# Patient Record
Sex: Male | Born: 1970
Health system: Southern US, Community
[De-identification: ages and names within clinical notes are randomized; demographics above are authoritative.]

## PROBLEM LIST (undated history)

## (undated) DIAGNOSIS — I10 Essential (primary) hypertension: Secondary | ICD-10-CM

## (undated) HISTORY — PX: APPENDECTOMY: SHX54

## (undated) HISTORY — PX: CHOLECYSTECTOMY: SHX55

---

## 2020-07-26 ENCOUNTER — Other Ambulatory Visit: Payer: Self-pay | Admitting: Family Medicine

## 2020-07-26 DIAGNOSIS — B181 Chronic viral hepatitis B without delta-agent: Secondary | ICD-10-CM

## 2020-07-31 ENCOUNTER — Other Ambulatory Visit (HOSPITAL_COMMUNITY): Payer: Self-pay | Admitting: Internal Medicine

## 2020-07-31 ENCOUNTER — Other Ambulatory Visit: Payer: Self-pay

## 2020-07-31 ENCOUNTER — Ambulatory Visit: Payer: Self-pay | Attending: Family Medicine | Admitting: Pharmacist

## 2020-07-31 DIAGNOSIS — Z79899 Other long term (current) drug therapy: Secondary | ICD-10-CM

## 2020-07-31 MED ORDER — VEMLIDY 25 MG PO TABS: ORAL_TABLET | ORAL | 1 refills | Status: DC

## 2020-07-31 NOTE — Addendum Note (Signed)
Addended by: Lois Huxley, Jeannett Senior L on: 07/31/2020 04:28 PM   Modules accepted: Orders

## 2020-07-31 NOTE — Progress Notes (Signed)
   S: Patient presents today for review of their specialty medication.   Patient is currently taking Vemlidy for HepB. Patient is managed by Dr. Docia Chuck  for this.   Dosing: 25 mg daily   Adherence: confirms. Has been taking for 3-4 years.   Efficacy: works well.   Monitoring:  Renal function/SCr: WNL per pt Urine tests (glucose, protein): WNL per pt LFTs: WNL per pt  Current adverse effects: denies    O:     No results found for: WBC, HGB, HCT, MCV, PLT    Chemistry   No results found for: NA, K, CL, CO2, BUN, CREATININE, GLU No results found for: CALCIUM, ALKPHOS, AST, ALT, BILITOT     A/P: 1. Medication review: patient currently on Vemlidy for Hep B. He continues to tolerate the medication well with good results. He has been taking the medication for 3-4 years. Per pt, his lab work is WNL. He has no questions or concerns at this time. No recommendation for any changes.   Butch Penny, PharmD, P[place]y [place], CPP Clinical Pharmacist [facility] [phone].

## 2020-08-14 ENCOUNTER — Ambulatory Visit
Admission: RE | Admit: 2020-08-14 | Discharge: 2020-08-14 | Disposition: A | Discharge status: Home / Self Care | Payer: No Typology Code available for payment source | Source: Ambulatory Visit | Attending: Family Medicine | Admitting: Family Medicine

## 2020-08-14 DIAGNOSIS — B181 Chronic viral hepatitis B without delta-agent: Secondary | ICD-10-CM

## 2020-08-16 ENCOUNTER — Other Ambulatory Visit (HOSPITAL_COMMUNITY): Payer: Self-pay

## 2020-08-22 ENCOUNTER — Other Ambulatory Visit (HOSPITAL_COMMUNITY): Payer: Self-pay

## 2020-08-22 MED FILL — Tenofovir Alafenamide Fumarate Tab 25 MG: ORAL | 30 days supply | Qty: 30 | Fill #0 | Status: CN

## 2020-08-25 ENCOUNTER — Other Ambulatory Visit (HOSPITAL_COMMUNITY): Payer: Self-pay

## 2020-09-01 ENCOUNTER — Other Ambulatory Visit (HOSPITAL_COMMUNITY): Payer: Self-pay

## 2020-09-01 MED FILL — Tenofovir Alafenamide Fumarate Tab 25 MG: ORAL | 30 days supply | Qty: 30 | Fill #0 | Status: CN

## 2020-09-06 ENCOUNTER — Other Ambulatory Visit (HOSPITAL_COMMUNITY): Payer: Self-pay

## 2020-09-28 ENCOUNTER — Other Ambulatory Visit (HOSPITAL_COMMUNITY): Payer: Self-pay

## 2020-09-28 MED ORDER — AMLODIPINE BESYLATE 5 MG PO TABS
5.0000 mg | ORAL_TABLET | Freq: Every day | ORAL | 5 refills | Status: DC
  Filled 2020-09-28: qty 30, 30d supply, fill #0
  Filled 2020-12-29: qty 30, 30d supply, fill #1
  Filled 2021-03-16: qty 30, 30d supply, fill #2
  Filled 2021-05-03: qty 30, 30d supply, fill #3
  Filled 2021-06-01: qty 30, 30d supply, fill #4

## 2020-12-29 ENCOUNTER — Other Ambulatory Visit (HOSPITAL_COMMUNITY): Payer: Self-pay

## 2021-03-14 ENCOUNTER — Other Ambulatory Visit (HOSPITAL_COMMUNITY): Payer: Self-pay

## 2021-03-14 MED ORDER — TENOFOVIR DISOPROXIL FUMARATE 300 MG PO TABS
300.0000 mg | ORAL_TABLET | Freq: Every day | ORAL | 12 refills | Status: DC
  Filled 2021-03-14 – 2021-03-16 (×2): qty 30, 30d supply, fill #0

## 2021-03-15 ENCOUNTER — Other Ambulatory Visit (HOSPITAL_COMMUNITY): Payer: Self-pay

## 2021-03-16 ENCOUNTER — Other Ambulatory Visit (HOSPITAL_COMMUNITY): Payer: Self-pay

## 2021-03-16 ENCOUNTER — Other Ambulatory Visit: Payer: Self-pay | Admitting: Pharmacist

## 2021-03-16 DIAGNOSIS — B181 Chronic viral hepatitis B without delta-agent: Secondary | ICD-10-CM

## 2021-03-16 MED ORDER — TENOFOVIR DISOPROXIL FUMARATE 300 MG PO TABS
300.0000 mg | ORAL_TABLET | Freq: Every day | ORAL | 12 refills | Status: DC
  Filled 2021-03-16: qty 30, 30d supply, fill #0
  Filled 2021-04-09: qty 30, 30d supply, fill #1
  Filled 2021-05-03: qty 30, 30d supply, fill #2
  Filled 2021-06-01 – 2021-06-04 (×3): qty 30, 30d supply, fill #3
  Filled 2021-06-27: qty 30, 30d supply, fill #4
  Filled 2021-07-25: qty 30, 30d supply, fill #5
  Filled 2021-09-05: qty 30, 30d supply, fill #6
  Filled 2021-09-28: qty 30, 30d supply, fill #7
  Filled 2021-10-25: qty 30, 30d supply, fill #8
  Filled 2021-11-15: qty 30, 30d supply, fill #9
  Filled 2021-12-17: qty 30, 30d supply, fill #10
  Filled 2022-01-17: qty 30, 30d supply, fill #11
  Filled 2022-02-18: qty 30, 30d supply, fill #12

## 2021-03-16 MED FILL — Tenofovir Alafenamide Fumarate Tab 25 MG: ORAL | 30 days supply | Qty: 30 | Fill #0 | Status: CN

## 2021-03-19 ENCOUNTER — Other Ambulatory Visit (HOSPITAL_COMMUNITY): Payer: Self-pay

## 2021-03-27 ENCOUNTER — Other Ambulatory Visit (HOSPITAL_COMMUNITY): Payer: Self-pay

## 2021-04-09 ENCOUNTER — Other Ambulatory Visit (HOSPITAL_COMMUNITY): Payer: Self-pay

## 2021-04-13 ENCOUNTER — Other Ambulatory Visit (HOSPITAL_COMMUNITY): Payer: Self-pay

## 2021-04-17 ENCOUNTER — Other Ambulatory Visit (HOSPITAL_COMMUNITY): Payer: Self-pay

## 2021-05-02 ENCOUNTER — Other Ambulatory Visit (HOSPITAL_COMMUNITY): Payer: Self-pay

## 2021-05-03 ENCOUNTER — Other Ambulatory Visit (HOSPITAL_COMMUNITY): Payer: Self-pay

## 2021-05-04 ENCOUNTER — Other Ambulatory Visit (HOSPITAL_COMMUNITY): Payer: Self-pay

## 2021-05-09 ENCOUNTER — Other Ambulatory Visit (HOSPITAL_COMMUNITY): Payer: Self-pay

## 2021-06-01 ENCOUNTER — Other Ambulatory Visit (HOSPITAL_COMMUNITY): Payer: Self-pay

## 2021-06-04 ENCOUNTER — Other Ambulatory Visit (HOSPITAL_COMMUNITY): Payer: Self-pay

## 2021-06-05 ENCOUNTER — Other Ambulatory Visit (HOSPITAL_COMMUNITY): Payer: Self-pay

## 2021-06-26 ENCOUNTER — Other Ambulatory Visit (HOSPITAL_COMMUNITY): Payer: Self-pay

## 2021-06-27 ENCOUNTER — Other Ambulatory Visit (HOSPITAL_COMMUNITY): Payer: Self-pay

## 2021-06-28 ENCOUNTER — Other Ambulatory Visit (HOSPITAL_COMMUNITY): Payer: Self-pay

## 2021-06-29 ENCOUNTER — Other Ambulatory Visit (HOSPITAL_COMMUNITY): Payer: Self-pay

## 2021-06-29 MED ORDER — AMLODIPINE BESYLATE 5 MG PO TABS
5.0000 mg | ORAL_TABLET | Freq: Every day | ORAL | 9 refills | Status: DC
  Filled 2021-06-29: qty 30, 30d supply, fill #0
  Filled 2021-07-25: qty 30, 30d supply, fill #1
  Filled 2021-09-06: qty 30, 30d supply, fill #2
  Filled 2021-10-25: qty 30, 30d supply, fill #3
  Filled 2021-11-20: qty 30, 30d supply, fill #4
  Filled 2021-12-17: qty 30, 30d supply, fill #5
  Filled 2022-01-17: qty 30, 30d supply, fill #6
  Filled 2022-02-18: qty 30, 30d supply, fill #7
  Filled 2022-04-23: qty 30, 30d supply, fill #8
  Filled 2022-05-14: qty 30, 30d supply, fill #9

## 2021-07-25 ENCOUNTER — Other Ambulatory Visit (HOSPITAL_COMMUNITY): Payer: Self-pay

## 2021-08-02 ENCOUNTER — Ambulatory Visit: Payer: No Typology Code available for payment source | Admitting: Pharmacist

## 2021-08-02 ENCOUNTER — Other Ambulatory Visit: Payer: Self-pay

## 2021-08-02 DIAGNOSIS — B181 Chronic viral hepatitis B without delta-agent: Secondary | ICD-10-CM

## 2021-08-03 NOTE — Progress Notes (Signed)
Virtual Visit via Telephone Note ? ?I connected with Patrick Johns on 08/03/21 at  3:15 PM EDT by telephone and verified that I am speaking with the correct person using two identifiers. ? ?Location: ?Patient: Home ?Provider: Office ?  ?I discussed the limitations, risks, security and privacy concerns of performing an evaluation and management service by telephone and the availability of in person appointments. I also discussed with the patient that there may be a patient responsible charge related to this service. The patient expressed understanding and agreed to proceed. ? ?HPI: Patrick Johns is a 51 y.o. male who presents for follow-up of their long-term specialty medication, Viread. ? ?There are no problems to display for this patient. ? ? ?Patient's Medications  ?New Prescriptions  ? No medications on file  ?Previous Medications  ? AMLODIPINE (NORVASC) 5 MG TABLET    Take 1 tablet by mouth daily.  ? TENOFOVIR (VIREAD) 300 MG TABLET    Take 1 tablet (300 mg total) by mouth daily.  ? TENOFOVIR ALAFENAMIDE FUMARATE (VEMLIDY) 25 MG TABS    1 tablet with food orally once a day.  ?Modified Medications  ? No medications on file  ?Discontinued Medications  ? No medications on file  ? ? ?Allergies: ?Not on File ? ?Past Medical History: ?No past medical history on file. ? ?Social History: ?Social History  ? ?Socioeconomic History  ? Marital status: Married  ?  Spouse name: Not on file  ? Number of children: Not on file  ? Years of education: Not on file  ? Highest education level: Not on file  ?Occupational History  ? Not on file  ?Tobacco Use  ? Smoking status: Not on file  ? Smokeless tobacco: Not on file  ?Substance and Sexual Activity  ? Alcohol use: Not on file  ? Drug use: Not on file  ? Sexual activity: Not on file  ?Other Topics Concern  ? Not on file  ?Social History Narrative  ? Not on file  ? ?Social Determinants of Health  ? ?Financial Resource Strain: Not on file  ?Food Insecurity: Not on file   ?Transportation Needs: Not on file  ?Physical Activity: Not on file  ?Stress: Not on file  ?Social Connections: Not on file  ? ? ?Labs: ?No results found for: HIV1RNAQUANT, HIV1RNAVL, CD4TABS ? ?RPR and STI ?No results found for: LABRPR, RPRTITER ? ?   ? View : No data to display.  ?  ?  ?  ? ? ?Hepatitis B ?No results found for: HEPBSAB, HEPBSAG, HEPBCAB ?Hepatitis C ?No results found for: [place], HCVRNAPCRQN ?Hepatitis A ?No results found for: HAV ?Lipids: ?No results found for: CHOL, TRIG, HDL, CHOLHDL, VLDL, LDLCALC ? ?Assessment: ?I spoke with Patrick Johns today regarding their specialty medication, Viread. Patient takes it every day without any issues or missed doses. No problems with adverse effects or tolerability except for some fatigue. Patient will take a night to see if that helps. No problems getting it from [facility]. Updated/reviewed medication list - no drug interactions. All questions answered. Will follow up in 1 year. ? ?Plan: ?- Continue Viread ?- Follow up in 1 year ?  ?I discussed the assessment and treatment plan with the patient. The patient was provided an opportunity to ask questions and all were answered. The patient agreed with the plan and demonstrated an understanding of the instructions. ?  ?The patient was advised to call back or seek an in-person evaluation if the symptoms worsen  or if the condition fails to improve as anticipated. ? ?I provided 15 minutes of non-face-to-face time during this encounter. ? ?Janda Cargo L. Nicholaos Schippers, PharmD, BCIDP, AAHIVP, CPP ?Clinical Pharmacist Practitioner ?Infectious Diseases Clinical Pharmacist ?[facility] [place] [facility] ?08/03/2021, 9:53 AM ? ? ?

## 2021-08-31 ENCOUNTER — Other Ambulatory Visit: Payer: Self-pay

## 2021-08-31 DIAGNOSIS — M79629 Pain in unspecified upper arm: Secondary | ICD-10-CM | POA: Insufficient documentation

## 2021-08-31 DIAGNOSIS — Y92410 Unspecified street and highway as the place of occurrence of the external cause: Secondary | ICD-10-CM | POA: Insufficient documentation

## 2021-08-31 DIAGNOSIS — M549 Dorsalgia, unspecified: Secondary | ICD-10-CM | POA: Diagnosis present

## 2021-08-31 DIAGNOSIS — M25519 Pain in unspecified shoulder: Secondary | ICD-10-CM | POA: Insufficient documentation

## 2021-08-31 DIAGNOSIS — M542 Cervicalgia: Secondary | ICD-10-CM | POA: Diagnosis not present
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
## 2021-09-01 ENCOUNTER — Emergency Department (HOSPITAL_BASED_OUTPATIENT_CLINIC_OR_DEPARTMENT_OTHER)
Admission: EM | Admit: 2021-09-01 | Discharge: 2021-09-01 | Disposition: A | Discharge status: Home / Self Care | Payer: No Typology Code available for payment source | Attending: Emergency Medicine | Admitting: Emergency Medicine

## 2021-09-01 NOTE — ED Notes (Signed)
Did not want discharge vitals. 
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
## 2021-09-05 ENCOUNTER — Ambulatory Visit
Admission: RE | Admit: 2021-09-05 | Discharge: 2021-09-05 | Disposition: A | Discharge status: Home / Self Care | Payer: No Typology Code available for payment source | Source: Ambulatory Visit | Attending: Family Medicine | Admitting: Family Medicine

## 2021-09-05 ENCOUNTER — Other Ambulatory Visit: Payer: Self-pay | Admitting: Family Medicine

## 2021-09-05 ENCOUNTER — Other Ambulatory Visit (HOSPITAL_COMMUNITY): Payer: Self-pay

## 2021-09-05 DIAGNOSIS — R52 Pain, unspecified: Secondary | ICD-10-CM

## 2021-09-06 ENCOUNTER — Other Ambulatory Visit (HOSPITAL_COMMUNITY): Payer: Self-pay

## 2021-09-12 ENCOUNTER — Ambulatory Visit: Payer: No Typology Code available for payment source | Attending: Family Medicine | Admitting: Physical Therapy

## 2021-09-12 ENCOUNTER — Encounter: Payer: Self-pay | Admitting: Physical Therapy

## 2021-09-12 DIAGNOSIS — M25542 Pain in joints of left hand: Secondary | ICD-10-CM | POA: Diagnosis not present

## 2021-09-12 DIAGNOSIS — M79645 Pain in left finger(s): Secondary | ICD-10-CM | POA: Diagnosis present

## 2021-09-12 DIAGNOSIS — M6281 Muscle weakness (generalized): Secondary | ICD-10-CM | POA: Insufficient documentation
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
## 2021-09-19 ENCOUNTER — Encounter: Payer: Self-pay | Admitting: Physical Therapy

## 2021-09-19 ENCOUNTER — Ambulatory Visit: Payer: No Typology Code available for payment source | Admitting: Physical Therapy

## 2021-09-19 DIAGNOSIS — M6281 Muscle weakness (generalized): Secondary | ICD-10-CM

## 2021-09-19 DIAGNOSIS — M79645 Pain in left finger(s): Secondary | ICD-10-CM | POA: Diagnosis not present

## 2021-09-19 DIAGNOSIS — M25542 Pain in joints of left hand: Secondary | ICD-10-CM
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
## 2021-09-25 ENCOUNTER — Other Ambulatory Visit (HOSPITAL_COMMUNITY): Payer: Self-pay

## 2021-09-25 ENCOUNTER — Ambulatory Visit: Payer: No Typology Code available for payment source | Admitting: Physical Therapy

## 2021-09-25 ENCOUNTER — Encounter: Payer: Self-pay | Admitting: Physical Therapy

## 2021-09-25 DIAGNOSIS — M79645 Pain in left finger(s): Secondary | ICD-10-CM | POA: Diagnosis not present

## 2021-09-25 DIAGNOSIS — M6281 Muscle weakness (generalized): Secondary | ICD-10-CM

## 2021-09-25 DIAGNOSIS — M25542 Pain in joints of left hand: Secondary | ICD-10-CM
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
## 2021-09-26 ENCOUNTER — Other Ambulatory Visit (HOSPITAL_COMMUNITY): Payer: Self-pay

## 2021-09-28 ENCOUNTER — Other Ambulatory Visit (HOSPITAL_COMMUNITY): Payer: Self-pay

## 2021-10-02 ENCOUNTER — Encounter: Payer: No Typology Code available for payment source | Admitting: Physical Therapy

## 2021-10-09 ENCOUNTER — Ambulatory Visit: Payer: No Typology Code available for payment source | Admitting: Physical Therapy

## 2021-10-09 ENCOUNTER — Telehealth: Payer: Self-pay | Admitting: Physical Therapy
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
## 2021-10-09 NOTE — Telephone Encounter (Signed)
Pt missed PT appointment today 10/09/21 at 11:00am.  PT spoke to Pt and he wasn't clear about what date his next visit was.  He stated he is feeling much better and is ready to d/c.    Baruch Merl, PT 10/09/21 11:26 AM

## 2021-10-23 ENCOUNTER — Other Ambulatory Visit (HOSPITAL_COMMUNITY): Payer: Self-pay

## 2021-10-25 ENCOUNTER — Other Ambulatory Visit (HOSPITAL_COMMUNITY): Payer: Self-pay

## 2021-10-26 ENCOUNTER — Other Ambulatory Visit (HOSPITAL_COMMUNITY): Payer: Self-pay

## 2021-11-15 ENCOUNTER — Other Ambulatory Visit (HOSPITAL_COMMUNITY): Payer: Self-pay

## 2021-11-20 ENCOUNTER — Other Ambulatory Visit (HOSPITAL_COMMUNITY): Payer: Self-pay

## 2021-11-23 ENCOUNTER — Telehealth: Payer: No Typology Code available for payment source | Admitting: Physician Assistant

## 2021-11-23 DIAGNOSIS — B356 Tinea cruris: Secondary | ICD-10-CM | POA: Diagnosis not present

## 2021-11-23 MED ORDER — CLOTRIMAZOLE 1 % EX CREA: 1.0000 | TOPICAL_CREAM | Freq: Two times a day (BID) | CUTANEOUS | 0 refills | Status: AC

## 2021-11-23 NOTE — Progress Notes (Signed)
I have spent 5 minutes in review of e-visit questionnaire, review and updating patient chart, medical decision making and response to patient.   Jacelynn Hayton Cody Inika Bellanger, PA-C    

## 2021-11-23 NOTE — Progress Notes (Signed)
E-Visit for [employer]  We are sorry that you are not feeling well. Here is how we plan to help!  Based on what you shared with me it looks like you have tinea cruris, or "Jock Itch".  The symptoms of Jock Itch include red, peeling, itchy rash that affects the groin (crease where the leg meets the trunk).  This fungal infection can be spread through shared towels, clothing, bedding, or hard surfaces (particularly in moist areas) such as shower stalls, locker room floors, or pool area that has the fungus present. If you have a fungal infection on one part of your body, you can also spread it to other parts. For instance, men with a fungal infection on their feet sometimes spread it to their groin.  I am recommending:Clotrimazole 1% cream or gel, apply to area twice per day I will try to send in as a script to make it cheaper.     HOME CARE:  Keep affected area clean, dry, and cool. Wash with soap and shampoo after sports or exercise and dry yourself well after bathing or swimming Wear cotton underwear and change them if they become damp or sweaty. Avoid using swimming pools, public showers, or baths.  GET HELP RIGHT AWAY IF:  Symptoms that don't away after treatment. Severe itching that persists. If your rash spreads or swells. If your rash begins to have drainage or smell. You develop a fever.  MAKE SURE YOU   Understand these instructions. Will watch your condition. Will get help right away if you are not doing well or get worse.  Thank you for choosing an e-visit.  Your e-visit answers were reviewed by a board certified advanced clinical practitioner to complete your personal care plan. Depending upon the condition, your plan could have included both over the counter or prescription medications.  Please review your pharmacy choice. Make sure the pharmacy is open so you can pick up prescription now. If there is a problem, you may contact your provider through [employer] and  have the prescription routed to another pharmacy.  Your safety is important to [place]. If you have drug allergies check your prescription carefully.   For the next 24 hours you can use MyChart to ask questions about today's visit, request a non-urgent call back, or ask for a work or school excuse. You will get an email in the next two days asking about your experience. I hope that your e-visit has been valuable and will speed your recovery.   References or for more information:  [email or web address]=search_[email or web address]=3~52&usage_type=default&display_rank=3

## 2021-12-17 ENCOUNTER — Other Ambulatory Visit (HOSPITAL_COMMUNITY): Payer: Self-pay

## 2021-12-24 ENCOUNTER — Other Ambulatory Visit (HOSPITAL_COMMUNITY): Payer: Self-pay

## 2021-12-26 ENCOUNTER — Other Ambulatory Visit: Payer: Self-pay | Admitting: Gastroenterology

## 2021-12-26 DIAGNOSIS — Z8619 Personal history of other infectious and parasitic diseases: Secondary | ICD-10-CM

## 2021-12-28 ENCOUNTER — Ambulatory Visit
Admission: RE | Admit: 2021-12-28 | Discharge: 2021-12-28 | Disposition: A | Discharge status: Home / Self Care | Payer: No Typology Code available for payment source | Source: Ambulatory Visit | Attending: Gastroenterology | Admitting: Gastroenterology

## 2021-12-28 ENCOUNTER — Other Ambulatory Visit: Payer: No Typology Code available for payment source

## 2021-12-28 DIAGNOSIS — Z8619 Personal history of other infectious and parasitic diseases: Secondary | ICD-10-CM

## 2022-01-06 ENCOUNTER — Ambulatory Visit (INDEPENDENT_AMBULATORY_CARE_PROVIDER_SITE_OTHER): Payer: No Typology Code available for payment source

## 2022-01-06 ENCOUNTER — Ambulatory Visit (HOSPITAL_COMMUNITY)
Admission: EM | Admit: 2022-01-06 | Discharge: 2022-01-06 | Disposition: A | Discharge status: Home / Self Care | Payer: No Typology Code available for payment source | Attending: Emergency Medicine | Admitting: Emergency Medicine

## 2022-01-06 ENCOUNTER — Encounter (HOSPITAL_COMMUNITY): Payer: Self-pay | Admitting: Emergency Medicine

## 2022-01-06 ENCOUNTER — Other Ambulatory Visit: Payer: Self-pay

## 2022-01-06 DIAGNOSIS — M79675 Pain in left toe(s): Secondary | ICD-10-CM

## 2022-01-06 DIAGNOSIS — S99922A Unspecified injury of left foot, initial encounter: Secondary | ICD-10-CM | POA: Diagnosis not present

## 2022-01-06 DIAGNOSIS — M79672 Pain in left foot: Secondary | ICD-10-CM

## 2022-01-06 HISTORY — DX: Essential (primary) hypertension: I10

## 2022-01-06 MED ORDER — ACETAMINOPHEN 325 MG PO TABS: 325.0000 mg | ORAL_TABLET | Freq: Four times a day (QID) | ORAL | 0 refills | Status: AC | PRN

## 2022-01-06 NOTE — Discharge Instructions (Addendum)
Your x-ray was negative. You can take the Tylenol up to 3 times daily as needed for pain.  Continue to ice the area for 20 minutes at a time a few times daily.  Elevate the leg to help with swelling.  You can follow up with the orthopedic specialists if symptoms persist.

## 2022-01-06 NOTE — ED Provider Notes (Signed)
[facility]    CSN: [id number] Arrival date & time: 01/06/22  1706     History   Chief Complaint Chief Complaint  Patient presents with   Foot Pain    HPI Patrick Johns is a 51 y.o. male.  Presents with toe injury. Yesterday slipped on steps and jammed left big toe. Swelling and pain to the area. 8/10 yesterday, better today. Applied ice and tried a muscle relaxer.  No pain medicine.  Able to walk but pain with weight bearing. Using crutches.   Past Medical History:  Diagnosis Date   Hypertension     There are no problems to display for this patient.  Past Surgical History:  Procedure Laterality Date   APPENDECTOMY     CHOLECYSTECTOMY       Home Medications    Prior to Admission medications   Medication Sig Start Date End Date Taking? Authorizing Provider  acetaminophen (TYLENOL) 325 MG tablet Take 1 tablet (325 mg total) by mouth every 6 (six) hours as needed for up to 3 days for moderate pain. 01/06/22 01/09/22 Yes Makenley Shimp, Lurena Joiner, PA-C  amLODipine (NORVASC) 5 MG tablet Take 1 tablet by mouth daily. 06/29/21     clotrimazole (LOTRIMIN) 1 % cream Apply 1 Application topically 2 (two) times daily. 11/23/21   Waldon Merl, PA-C  tenofovir (VIREAD) 300 MG tablet Take 1 tablet (300 mg total) by mouth daily. 03/16/21   Kuppelweiser, Cassie L, RPH-CPP    Family History History reviewed. No pertinent family history.  Social History Social History   Tobacco Use   Smoking status: Never   Smokeless tobacco: Never  Vaping Use   Vaping Use: Never used  Substance Use Topics   Alcohol use: Not Currently   Drug use: Not Currently     Allergies   Patient has no known allergies.   Review of Systems Review of Systems Per HPI  Physical Exam Triage Vital Signs ED Triage Vitals [01/06/22 1819]  Enc Vitals Group     BP      Pulse      Resp      Temp      Temp src      SpO2      Weight      Height      Head Circumference      Peak Flow       Pain Score 8     Pain Loc      Pain Edu?      Excl. in GC?    No data found.  Updated Vital Signs BP (!) 148/82 (BP Location: Left Arm)   Pulse 63   Temp 98.1 F (36.7 C) (Oral)   Resp 18   SpO2 97%     Physical Exam Vitals and nursing note reviewed.  Constitutional:      General: He is not in acute distress. Eyes:     Conjunctiva/sclera: Conjunctivae normal.  Cardiovascular:     Rate and Rhythm: Normal rate and regular rhythm.     Pulses: Normal pulses.     Heart sounds: Normal heart sounds.  Pulmonary:     Effort: Pulmonary effort is normal.     Breath sounds: Normal breath sounds.  Musculoskeletal:        General: Tenderness present. No deformity. Normal range of motion.     Comments: Left great toe pain with palpation distally. No obvious deformity. No skin changes or wounds. Sensation intact, cap refill < 2 seconds.  Lesser toes without pain  Skin:    General: Skin is warm and dry.     Findings: No bruising.  Neurological:     Mental Status: He is alert and oriented to person, place, and time.     UC Treatments / Results  Labs (all labs ordered are listed, but only abnormal results are displayed) Labs Reviewed - No data to display  EKG   Radiology DG Foot Complete Left  Result Date: 01/06/2022 CLINICAL DATA:  Left toe injury EXAM: LEFT FOOT - COMPLETE 3 VIEW COMPARISON:  None Available. FINDINGS: There is no evidence of fracture or dislocation. There is no evidence of arthropathy or other focal bone abnormality. Mild soft tissue swelling of the great toe. IMPRESSION: No acute osseous abnormality. Electronically Signed   By: Allegra Lai M.D.   On: 01/06/2022 18:54    Procedures Procedures   Medications Ordered in UC Medications - No data to display  Initial Impression / Assessment and Plan / UC Course  I have reviewed the triage vital signs and the nursing notes.  Pertinent labs & imaging results that were available during my care of the  patient were reviewed by me and considered in my medical decision making (see chart for details).  Left foot xray negative. Ice, elevation, can try tylenol 3x daily as needed for the next few days. 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 Follow up with ortho if pain persists. Return precautions discussed. Patient agrees to plan  Final Clinical Impressions(s) / UC Diagnoses   Final diagnoses:  Great toe pain, left  Injury of toe on left foot, initial encounter     Discharge Instructions      Your x-ray was negative. You can take the Tylenol up to 3 times daily as needed for pain.  Continue to ice the area for 20 minutes at a time a few times daily.  Elevate the leg to help with swelling.  You can follow up with the orthopedic specialists if symptoms persist.      ED Prescriptions     Medication Sig Dispense Auth. Provider   acetaminophen (TYLENOL) 325 MG tablet Take 1 tablet (325 mg total) by mouth every 6 (six) hours as needed for up to 3 days for moderate pain. 12 tablet Manveer Gomes, Lurena Joiner, PA-C      PDMP not reviewed this encounter.   Kathrine Haddock 01/06/22 1902

## 2022-01-06 NOTE — ED Triage Notes (Signed)
Yesterday, slipped on steps.  Left great toe was injured , jammed into the next step.  Points to left great toe as source of pain.  Slight swelling noticed.  Last night took a muscle relaxer.  Denies taking any medication today.  Has used ice on injury

## 2022-01-15 ENCOUNTER — Other Ambulatory Visit (HOSPITAL_COMMUNITY): Payer: Self-pay

## 2022-01-17 ENCOUNTER — Other Ambulatory Visit (HOSPITAL_COMMUNITY): Payer: Self-pay

## 2022-01-21 ENCOUNTER — Other Ambulatory Visit (HOSPITAL_COMMUNITY): Payer: Self-pay

## 2022-02-13 ENCOUNTER — Other Ambulatory Visit (HOSPITAL_COMMUNITY): Payer: Self-pay

## 2022-02-18 ENCOUNTER — Other Ambulatory Visit (HOSPITAL_COMMUNITY): Payer: Self-pay

## 2022-02-20 ENCOUNTER — Other Ambulatory Visit (HOSPITAL_COMMUNITY): Payer: Self-pay

## 2022-03-18 ENCOUNTER — Other Ambulatory Visit (HOSPITAL_COMMUNITY): Payer: Self-pay

## 2022-03-20 ENCOUNTER — Other Ambulatory Visit (HOSPITAL_COMMUNITY): Payer: Self-pay

## 2022-04-23 ENCOUNTER — Other Ambulatory Visit (HOSPITAL_COMMUNITY): Payer: Self-pay

## 2022-04-24 ENCOUNTER — Other Ambulatory Visit: Payer: Self-pay | Admitting: Pharmacist

## 2022-04-24 ENCOUNTER — Other Ambulatory Visit (HOSPITAL_COMMUNITY): Payer: Self-pay

## 2022-04-24 ENCOUNTER — Other Ambulatory Visit: Payer: Self-pay

## 2022-04-24 DIAGNOSIS — B181 Chronic viral hepatitis B without delta-agent: Secondary | ICD-10-CM

## 2022-04-24 MED ORDER — TENOFOVIR DISOPROXIL FUMARATE 300 MG PO TABS: ORAL_TABLET | ORAL | 12 refills | Status: DC

## 2022-04-24 MED ORDER — TENOFOVIR DISOPROXIL FUMARATE 300 MG PO TABS
300.0000 mg | ORAL_TABLET | Freq: Every day | ORAL | 12 refills | Status: AC
  Filled 2022-04-24: qty 30, 30d supply, fill #0
  Filled 2022-05-14: qty 30, 30d supply, fill #1
  Filled 2022-07-24: qty 30, 30d supply, fill #2
  Filled 2022-08-13: qty 30, 30d supply, fill #3
  Filled 2022-09-13: qty 30, 30d supply, fill #4
  Filled 2022-10-08 – 2022-12-20 (×3): qty 30, 30d supply, fill #5

## 2022-05-14 ENCOUNTER — Other Ambulatory Visit (HOSPITAL_COMMUNITY): Payer: Self-pay

## 2022-05-16 ENCOUNTER — Other Ambulatory Visit: Payer: Self-pay

## 2022-05-17 ENCOUNTER — Other Ambulatory Visit: Payer: Self-pay

## 2022-05-17 ENCOUNTER — Other Ambulatory Visit (HOSPITAL_COMMUNITY): Payer: Self-pay

## 2022-05-20 ENCOUNTER — Other Ambulatory Visit (HOSPITAL_COMMUNITY): Payer: Self-pay

## 2022-05-22 ENCOUNTER — Other Ambulatory Visit (HOSPITAL_COMMUNITY): Payer: Self-pay

## 2022-05-22 ENCOUNTER — Other Ambulatory Visit: Payer: Self-pay

## 2022-05-23 ENCOUNTER — Other Ambulatory Visit (HOSPITAL_COMMUNITY): Payer: Self-pay

## 2022-05-25 ENCOUNTER — Other Ambulatory Visit: Payer: Self-pay

## 2022-05-25 ENCOUNTER — Other Ambulatory Visit (HOSPITAL_COMMUNITY): Payer: Self-pay

## 2022-06-07 ENCOUNTER — Other Ambulatory Visit (HOSPITAL_COMMUNITY): Payer: Self-pay

## 2022-06-10 ENCOUNTER — Other Ambulatory Visit (HOSPITAL_COMMUNITY): Payer: Self-pay

## 2022-06-12 ENCOUNTER — Other Ambulatory Visit (HOSPITAL_COMMUNITY): Payer: Self-pay

## 2022-06-28 ENCOUNTER — Other Ambulatory Visit (HOSPITAL_COMMUNITY): Payer: Self-pay

## 2022-06-30 IMAGING — US US ABDOMEN LIMITED RUQ/ASCITES
1 series · 14 of 25 positions shown · non-contrast
Comparison: None.

CLINICAL DATA: Hepatitis B

Screening for hepatoma
EXAM:
ULTRASOUND ABDOMEN LIMITED RIGHT UPPER QUADRANT

[Series 1: us abdomen limited ruq/ascites · 0.19mm/px · 14 of 30 slices shown]
[im 1/30]
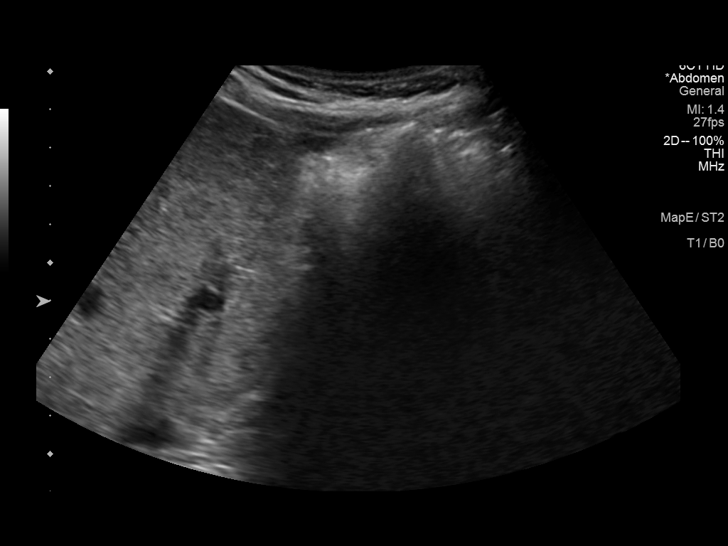
[im 3/30]
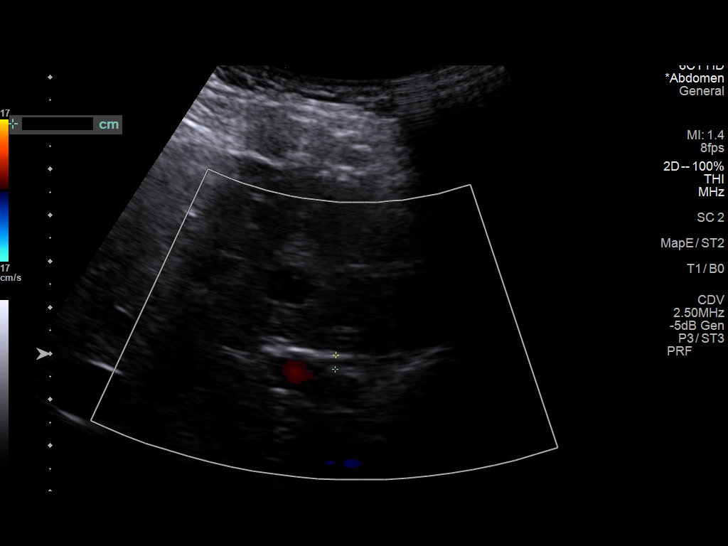
[im 5/30]
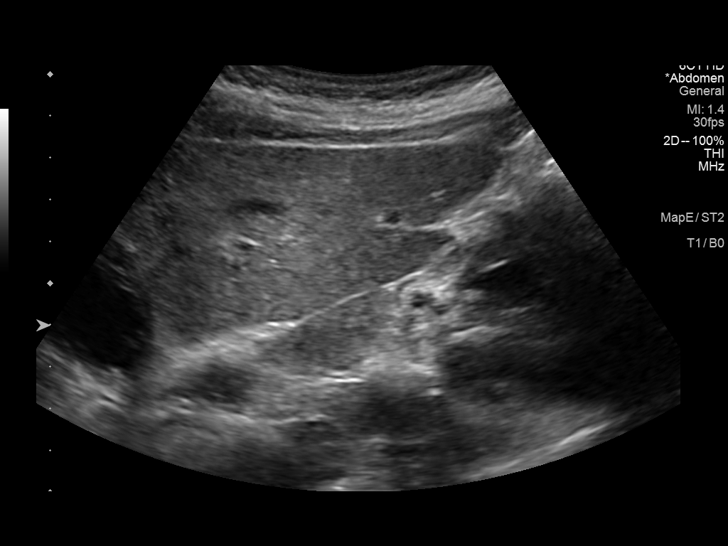
[im 8/30]
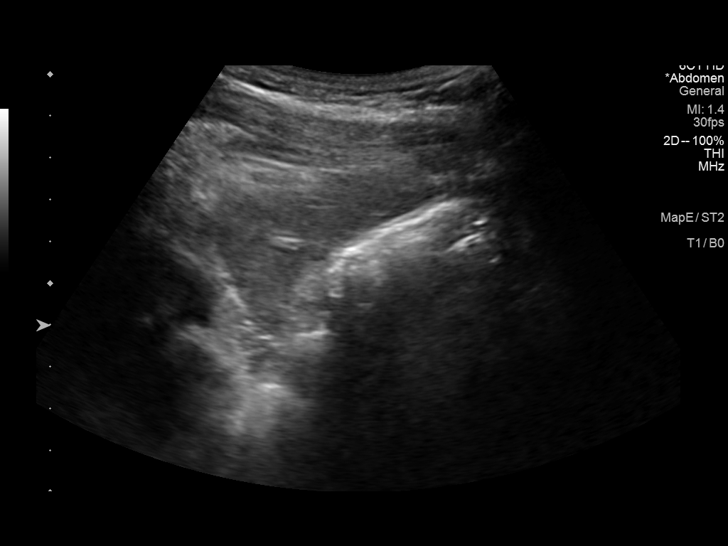
[im 10/30]
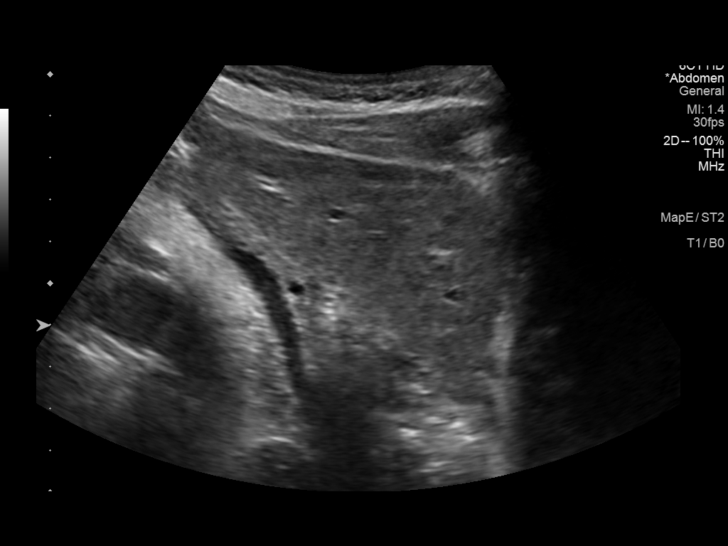
[im 11/30]
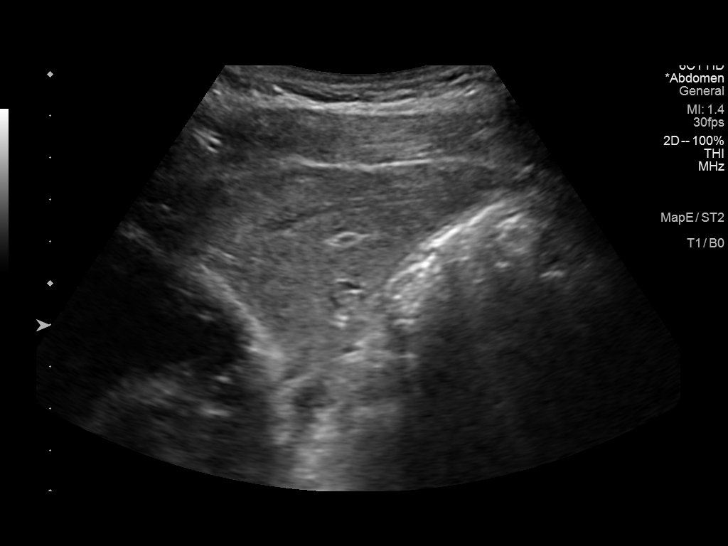
[im 14/30]
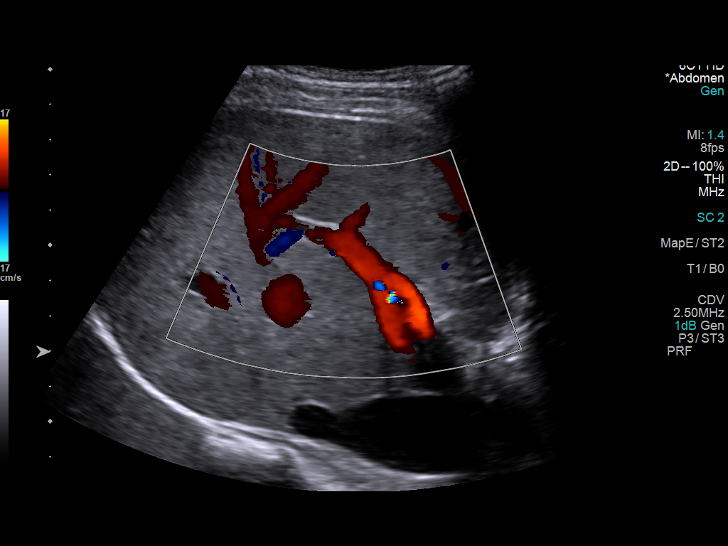
[im 16/30]
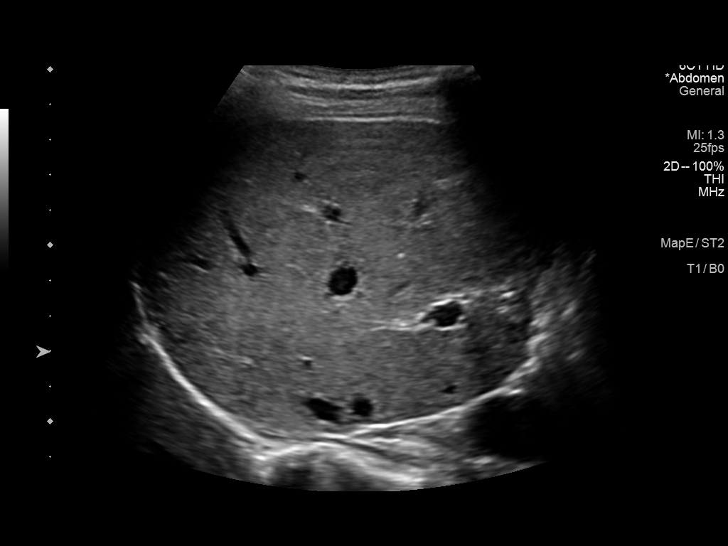
[im 19/30]
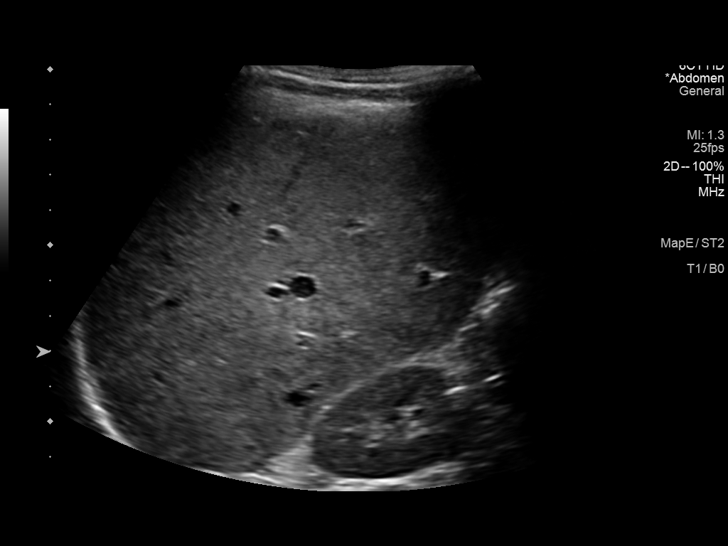
[im 20/30]
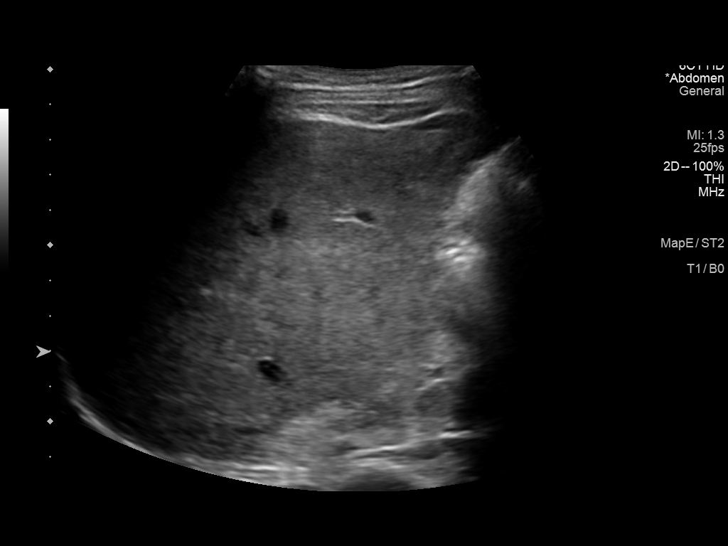
[im 22/30]
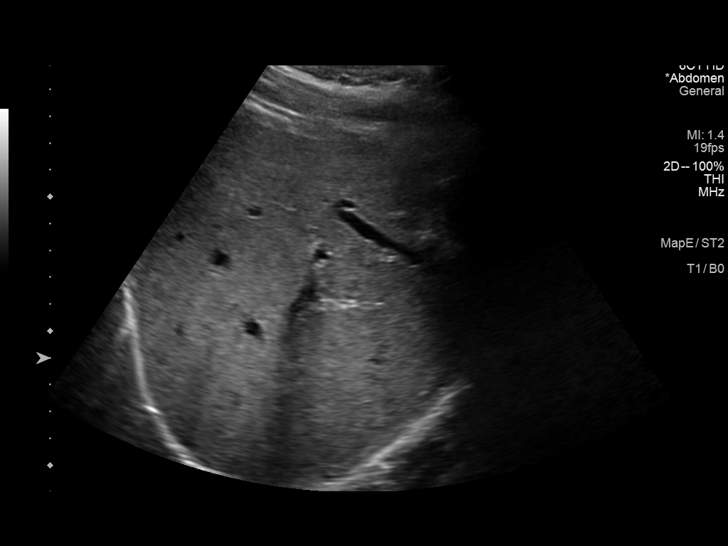
[im 25/30]
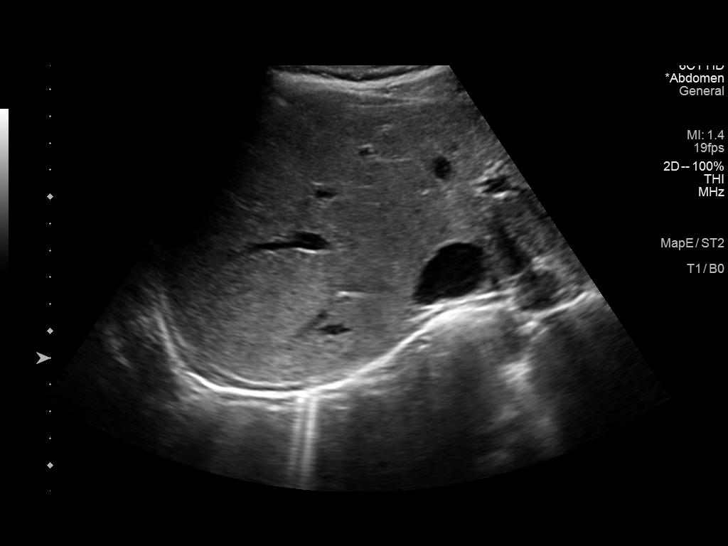
[im 27/30]
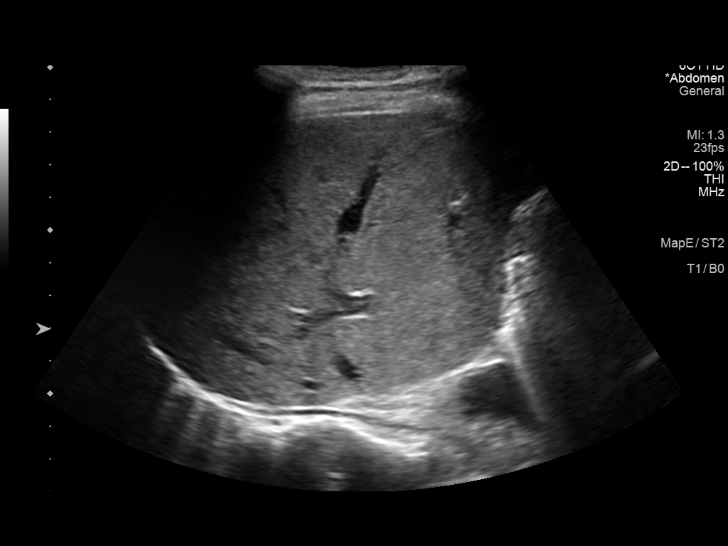
[im 30/30]
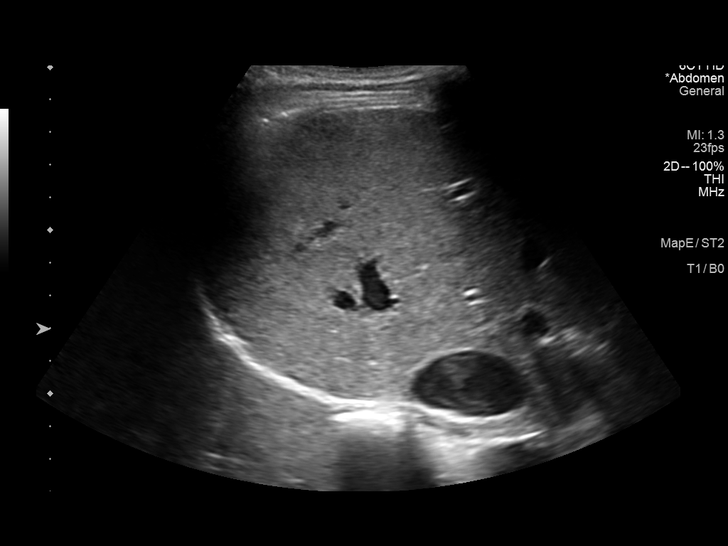

[14 of 25 positions shown; findings below may reference images not displayed]

FINDINGS: Gallbladder:

Status post cholecystectomy

Common bile duct:

Diameter: 3 mm

Liver:

No focal lesion identified. Within normal limits in parenchymal
echogenicity. Portal vein is patent on color Doppler imaging with
normal direction of blood flow towards the liver.

Other: None.
IMPRESSION: No significant sonographic abnormality of the liver.

## 2022-07-19 ENCOUNTER — Telehealth: Payer: Self-pay | Admitting: Pharmacist

## 2022-07-19 NOTE — Telephone Encounter (Signed)
Called patient to discuss HBV medication, Viread, for [facility] [employer]. No answer, left HIPAA compliant VM.  Alfonse Spruce, PharmD, CPP, BCIDP, [place] Clinical Pharmacist Practitioner Infectious [place] for Infectious Disease

## 2022-07-24 ENCOUNTER — Other Ambulatory Visit (HOSPITAL_COMMUNITY): Payer: Self-pay

## 2022-07-24 ENCOUNTER — Other Ambulatory Visit: Payer: Self-pay

## 2022-07-24 MED ORDER — AMLODIPINE BESYLATE 5 MG PO TABS
5.0000 mg | ORAL_TABLET | Freq: Every day | ORAL | 3 refills | Status: DC
  Filled 2022-07-24: qty 30, 30d supply, fill #0
  Filled 2022-09-13: qty 30, 30d supply, fill #1
  Filled 2022-12-04 – 2022-12-20 (×3): qty 30, 30d supply, fill #2
  Filled 2023-01-08 – 2023-01-17 (×2): qty 30, 30d supply, fill #3
  Filled 2023-02-04: qty 30, 30d supply, fill #4
  Filled 2023-03-11: qty 30, 30d supply, fill #5
  Filled 2023-04-08: qty 30, 30d supply, fill #6
  Filled 2023-05-02: qty 30, 30d supply, fill #7
  Filled 2023-06-02: qty 30, 30d supply, fill #8
  Filled 2023-07-18: qty 30, 30d supply, fill #9

## 2022-07-25 ENCOUNTER — Other Ambulatory Visit: Payer: Self-pay

## 2022-08-13 ENCOUNTER — Other Ambulatory Visit (HOSPITAL_COMMUNITY): Payer: Self-pay

## 2022-08-19 ENCOUNTER — Other Ambulatory Visit: Payer: Self-pay

## 2022-09-13 ENCOUNTER — Other Ambulatory Visit (HOSPITAL_COMMUNITY): Payer: Self-pay

## 2022-09-16 ENCOUNTER — Other Ambulatory Visit: Payer: Self-pay

## 2022-09-16 ENCOUNTER — Encounter: Payer: Self-pay | Admitting: Pharmacist

## 2022-09-26 ENCOUNTER — Other Ambulatory Visit (HOSPITAL_COMMUNITY): Payer: Self-pay

## 2022-10-04 ENCOUNTER — Telehealth: Payer: Self-pay

## 2022-10-04 NOTE — Telephone Encounter (Signed)
Attempted to call Leonette Most regarding his Viread therapy for the specialty pharmacy. Left message for him to call us back.  Irish Elders, PharmD PGY-1 [facility] Resident

## 2022-10-08 ENCOUNTER — Other Ambulatory Visit (HOSPITAL_COMMUNITY): Payer: Self-pay

## 2022-10-14 ENCOUNTER — Other Ambulatory Visit (HOSPITAL_COMMUNITY): Payer: Self-pay

## 2022-12-04 ENCOUNTER — Other Ambulatory Visit (HOSPITAL_COMMUNITY): Payer: Self-pay

## 2022-12-04 ENCOUNTER — Other Ambulatory Visit: Payer: Self-pay

## 2022-12-09 ENCOUNTER — Other Ambulatory Visit: Payer: Self-pay

## 2022-12-11 ENCOUNTER — Other Ambulatory Visit: Payer: Self-pay

## 2022-12-11 ENCOUNTER — Other Ambulatory Visit (HOSPITAL_COMMUNITY): Payer: Self-pay

## 2022-12-11 MED ORDER — TENOFOVIR DISOPROXIL FUMARATE 300 MG PO TABS
ORAL_TABLET | ORAL | 4 refills | Status: DC
  Filled 2023-01-08 – 2023-01-20 (×2): qty 30, 30d supply, fill #0
  Filled 2023-02-04 (×2): qty 30, 30d supply, fill #1
  Filled 2023-03-11: qty 30, 30d supply, fill #2
  Filled 2023-04-08: qty 30, 30d supply, fill #3
  Filled 2023-05-02: qty 30, 30d supply, fill #4
  Filled 2023-06-02: qty 30, 30d supply, fill #5
  Filled 2023-07-08: qty 30, 30d supply, fill #6
  Filled 2023-08-05: qty 30, 30d supply, fill #7
  Filled 2023-09-08 (×2): qty 30, 30d supply, fill #8
  Filled 2023-10-09: qty 30, 30d supply, fill #9
  Filled 2023-11-13: qty 30, 30d supply, fill #10

## 2022-12-16 ENCOUNTER — Other Ambulatory Visit: Payer: Self-pay

## 2022-12-18 ENCOUNTER — Other Ambulatory Visit (HOSPITAL_COMMUNITY): Payer: Self-pay

## 2022-12-20 ENCOUNTER — Other Ambulatory Visit: Payer: Self-pay

## 2022-12-20 ENCOUNTER — Other Ambulatory Visit (HOSPITAL_COMMUNITY): Payer: Self-pay

## 2023-01-06 ENCOUNTER — Other Ambulatory Visit: Payer: Self-pay

## 2023-01-08 ENCOUNTER — Other Ambulatory Visit: Payer: Self-pay

## 2023-01-16 ENCOUNTER — Other Ambulatory Visit: Payer: Self-pay

## 2023-01-16 ENCOUNTER — Other Ambulatory Visit (HOSPITAL_COMMUNITY): Payer: Self-pay

## 2023-01-17 ENCOUNTER — Other Ambulatory Visit (HOSPITAL_COMMUNITY): Payer: Self-pay

## 2023-01-17 ENCOUNTER — Other Ambulatory Visit: Payer: Self-pay

## 2023-01-20 ENCOUNTER — Other Ambulatory Visit (HOSPITAL_COMMUNITY): Payer: Self-pay

## 2023-01-20 ENCOUNTER — Other Ambulatory Visit: Payer: Self-pay

## 2023-01-21 ENCOUNTER — Other Ambulatory Visit (HOSPITAL_COMMUNITY): Payer: Self-pay

## 2023-01-21 ENCOUNTER — Other Ambulatory Visit: Payer: Self-pay

## 2023-01-23 ENCOUNTER — Other Ambulatory Visit (HOSPITAL_COMMUNITY): Payer: Self-pay

## 2023-02-04 ENCOUNTER — Other Ambulatory Visit (HOSPITAL_COMMUNITY): Payer: Self-pay

## 2023-02-04 ENCOUNTER — Other Ambulatory Visit: Payer: Self-pay

## 2023-02-04 NOTE — Progress Notes (Signed)
Specialty Pharmacy Refill Coordination Note  Patrick Johns is a 52 y.o. male contacted today regarding refills of specialty medication(s) Tenofovir Disoproxil Fumarate .  Patient requested [name] at [facility] at [place]  on 02/14/23   Medication will be filled on 02/14/23.

## 2023-02-18 ENCOUNTER — Other Ambulatory Visit (HOSPITAL_COMMUNITY): Payer: Self-pay

## 2023-03-11 ENCOUNTER — Other Ambulatory Visit (HOSPITAL_COMMUNITY): Payer: Self-pay

## 2023-03-11 ENCOUNTER — Other Ambulatory Visit (HOSPITAL_COMMUNITY): Payer: Self-pay | Admitting: Pharmacy Technician

## 2023-03-11 NOTE — Progress Notes (Signed)
Specialty Pharmacy Refill Coordination Note  Patrick Johns is a 52 y.o. male contacted today regarding refills of specialty medication(s) Tenofovir Disoproxil Fumarate   Patient requested Pickup at [facility] at [place] date: 03/19/23   Medication will be filled on 03/18/23.

## 2023-03-17 ENCOUNTER — Other Ambulatory Visit (HOSPITAL_COMMUNITY): Payer: Self-pay

## 2023-03-20 ENCOUNTER — Other Ambulatory Visit (HOSPITAL_COMMUNITY): Payer: Self-pay

## 2023-03-26 ENCOUNTER — Other Ambulatory Visit: Payer: Self-pay

## 2023-04-03 ENCOUNTER — Other Ambulatory Visit: Payer: Self-pay

## 2023-04-08 ENCOUNTER — Other Ambulatory Visit (HOSPITAL_COMMUNITY): Payer: Self-pay

## 2023-04-08 ENCOUNTER — Other Ambulatory Visit: Payer: Self-pay

## 2023-04-08 ENCOUNTER — Other Ambulatory Visit (HOSPITAL_COMMUNITY): Payer: Self-pay | Admitting: Pharmacy Technician

## 2023-04-08 NOTE — Progress Notes (Signed)
Specialty Pharmacy Refill Coordination Note  Patrick Johns is a 52 y.o. male contacted today regarding refills of specialty medication(s) Tenofovir Disoproxil Fumarate   Patient requested Pickup at [facility] at [place] date: 04/16/23   Medication will be filled on 04/15/23.

## 2023-04-15 ENCOUNTER — Other Ambulatory Visit: Payer: Self-pay

## 2023-04-16 ENCOUNTER — Telehealth: Payer: Self-pay | Admitting: Pharmacist

## 2023-04-16 ENCOUNTER — Other Ambulatory Visit (HOSPITAL_COMMUNITY): Payer: Self-pay

## 2023-04-16 NOTE — Telephone Encounter (Signed)
Called patient to discuss hepatitis B medication, Viread, for [facility] [employer] [facility]. No answer, left HIPAA compliant VM.  Margarite Gouge, PharmD, CPP, BCIDP, AAHIVP Clinical Pharmacist Practitioner Infectious Diseases Clinical Pharmacist [facility] for Infectious [facility]

## 2023-05-02 ENCOUNTER — Other Ambulatory Visit: Payer: Self-pay

## 2023-05-02 NOTE — Progress Notes (Signed)
Specialty Pharmacy Refill Coordination Note  Patrick Johns is a 52 y.o. male contacted today regarding refills of specialty medication(s) Tenofovir Disoproxil Fumarate [name]   Patient requested Pickup at [facility] at [facility] date: 05/12/23   Medication will be filled on 05/12/23.

## 2023-05-05 ENCOUNTER — Other Ambulatory Visit (HOSPITAL_COMMUNITY): Payer: Self-pay

## 2023-05-12 ENCOUNTER — Other Ambulatory Visit: Payer: Self-pay

## 2023-05-15 ENCOUNTER — Other Ambulatory Visit (HOSPITAL_COMMUNITY): Payer: Self-pay

## 2023-06-02 ENCOUNTER — Other Ambulatory Visit: Payer: Self-pay

## 2023-06-02 NOTE — Progress Notes (Signed)
Specialty Pharmacy Refill Coordination Note  Patrick Johns is a 53 y.o. male contacted today regarding refills of specialty medication(s) Tenofovir Disoproxil Fumarate [name]   Patient requested Pickup at [facility] at [facility] date: 06/12/23   Medication will be filled on 06/11/23.

## 2023-06-11 ENCOUNTER — Other Ambulatory Visit: Payer: Self-pay

## 2023-07-04 ENCOUNTER — Other Ambulatory Visit: Payer: Self-pay

## 2023-07-07 ENCOUNTER — Other Ambulatory Visit: Payer: Self-pay

## 2023-07-08 ENCOUNTER — Other Ambulatory Visit (HOSPITAL_COMMUNITY): Payer: Self-pay

## 2023-07-08 NOTE — Progress Notes (Signed)
 Specialty Pharmacy Refill Coordination Note  Patrick Johns is a 53 y.o. male contacted today regarding refills of specialty medication(s) Tenofovir Disoproxil Fumarate [name]   Patient requested Pickup at [facility] at [facility] date: 07/17/23   Medication will be filled on 07/16/23.

## 2023-07-16 ENCOUNTER — Other Ambulatory Visit: Payer: Self-pay

## 2023-07-18 ENCOUNTER — Other Ambulatory Visit (HOSPITAL_COMMUNITY): Payer: Self-pay
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
## 2023-07-31 ENCOUNTER — Other Ambulatory Visit: Payer: Self-pay

## 2023-08-05 ENCOUNTER — Other Ambulatory Visit (HOSPITAL_COMMUNITY): Payer: Self-pay

## 2023-08-05 ENCOUNTER — Other Ambulatory Visit: Payer: Self-pay

## 2023-08-05 NOTE — Progress Notes (Signed)
 Specialty Pharmacy Ongoing Clinical Assessment Note  Patrick Johns is a 53 y.o. male who is being followed by the specialty pharmacy service for RxSp Hepatitis B   Patient's specialty medication(s) reviewed today: Tenofovir Disoproxil Fumarate (VIREAD)   Missed doses in the last 4 weeks: 1   Patient/Caregiver did not have any additional questions or concerns.   Therapeutic benefit summary: Patient is achieving benefit   Adverse events/side effects summary: No adverse events/side effects   Patient's therapy is appropriate to: Continue    Goals Addressed             This Visit's Progress    Achieve sustained HBV viral load suppression       Patient is  unable to be assessed as lab data is not available in EPIC . Patient will maintain adherence         Follow up:  6 months  Servando Snare Specialty Pharmacist

## 2023-08-05 NOTE — Progress Notes (Signed)
 Specialty Pharmacy Refill Coordination Note  Patrick Johns is a 53 y.o. male contacted today regarding refills of specialty medication(s) Tenofovir Disoproxil Fumarate [name]   Patient requested Pickup at [facility] at [facility] date: 08/14/23   Medication will be filled on 08/13/23.

## 2023-08-13 ENCOUNTER — Other Ambulatory Visit: Payer: Self-pay

## 2023-08-16 ENCOUNTER — Other Ambulatory Visit (HOSPITAL_COMMUNITY): Payer: Self-pay

## 2023-08-18 ENCOUNTER — Other Ambulatory Visit (HOSPITAL_COMMUNITY): Payer: Self-pay

## 2023-08-18 MED ORDER — AMLODIPINE BESYLATE 5 MG PO TABS
5.0000 mg | ORAL_TABLET | Freq: Every day | ORAL | 3 refills | Status: AC
  Filled 2023-08-18: qty 90, 90d supply, fill #0
  Filled 2023-12-31: qty 90, 90d supply, fill #1
  Filled 2024-04-20: qty 90, 90d supply, fill #2

## 2023-09-03 ENCOUNTER — Other Ambulatory Visit: Payer: Self-pay

## 2023-09-04 ENCOUNTER — Other Ambulatory Visit: Payer: Self-pay

## 2023-09-08 ENCOUNTER — Other Ambulatory Visit: Payer: Self-pay

## 2023-09-08 ENCOUNTER — Other Ambulatory Visit (HOSPITAL_COMMUNITY): Payer: Self-pay

## 2023-09-08 NOTE — Progress Notes (Signed)
 Specialty Pharmacy Refill Coordination Note  Patrick Johns is a 53 y.o. male contacted today regarding refills of specialty medication(s) Tenofovir  Disoproxil Fumarate (VIREAD )   Patient requested [name] at [facility] at [place] date: 09/12/23   Medication will be filled on 09/11/23.

## 2023-09-11 ENCOUNTER — Other Ambulatory Visit: Payer: Self-pay

## 2023-09-18 ENCOUNTER — Other Ambulatory Visit (HOSPITAL_COMMUNITY): Payer: Self-pay

## 2023-10-09 ENCOUNTER — Other Ambulatory Visit: Payer: Self-pay | Admitting: Pharmacy Technician

## 2023-10-09 ENCOUNTER — Other Ambulatory Visit: Payer: Self-pay

## 2023-10-09 NOTE — Progress Notes (Signed)
 Specialty Pharmacy Refill Coordination Note  Patrick Johns is a 53 y.o. male contacted today regarding refills of specialty medication(s) Tenofovir  Disoproxil Fumarate (VIREAD )   Patient requested [name] at [facility] at [place] date: 10/15/23   Medication will be filled on 10/14/23.

## 2023-10-14 ENCOUNTER — Other Ambulatory Visit: Payer: Self-pay

## 2023-11-07 ENCOUNTER — Other Ambulatory Visit (HOSPITAL_COMMUNITY): Payer: Self-pay

## 2023-11-11 ENCOUNTER — Other Ambulatory Visit (HOSPITAL_COMMUNITY): Payer: Self-pay

## 2023-11-13 ENCOUNTER — Other Ambulatory Visit: Payer: Self-pay

## 2023-11-13 NOTE — Progress Notes (Signed)
 Specialty Pharmacy Refill Coordination Note  Patrick Johns is a 52 y.o. male contacted today regarding refills of specialty medication(s) Tenofovir  Disoproxil Fumarate (VIREAD )   Patient requested [name] at [facility] at [place] date: 11/17/23   Medication will be filled on 07.03.25.

## 2023-11-24 ENCOUNTER — Other Ambulatory Visit: Payer: Self-pay | Admitting: Gastroenterology

## 2023-11-24 DIAGNOSIS — B181 Chronic viral hepatitis B without delta-agent: Secondary | ICD-10-CM

## 2023-12-15 ENCOUNTER — Other Ambulatory Visit: Payer: Self-pay

## 2023-12-15 ENCOUNTER — Other Ambulatory Visit (HOSPITAL_COMMUNITY): Payer: Self-pay

## 2023-12-15 MED ORDER — TENOFOVIR DISOPROXIL FUMARATE 300 MG PO TABS
300.0000 mg | ORAL_TABLET | Freq: Every day | ORAL | 4 refills | Status: AC
  Filled 2023-12-15: qty 30, 30d supply, fill #0
  Filled 2024-01-13: qty 30, 30d supply, fill #1
  Filled 2024-02-12: qty 30, 30d supply, fill #2
  Filled 2024-03-18 – 2024-04-20 (×2): qty 30, 30d supply, fill #3
  Filled 2024-05-12 – 2024-05-14 (×2): qty 30, 30d supply, fill #4
  Filled 2024-06-11: qty 30, 30d supply, fill #5

## 2023-12-15 NOTE — Progress Notes (Signed)
 Specialty Pharmacy Refill Coordination Note  Patrick Johns is a 53 y.o. male contacted today regarding refills of specialty medication(s) Tenofovir  Disoproxil Fumarate (VIREAD )   Patient requested [name] at [facility] at [place] date: 12/17/23   Medication will be filled on 12/16/23, pending refill approval.

## 2023-12-16 ENCOUNTER — Other Ambulatory Visit: Payer: Self-pay

## 2023-12-19 ENCOUNTER — Other Ambulatory Visit (HOSPITAL_COMMUNITY): Payer: Self-pay

## 2023-12-31 ENCOUNTER — Other Ambulatory Visit: Payer: Self-pay

## 2023-12-31 ENCOUNTER — Other Ambulatory Visit (HOSPITAL_COMMUNITY): Payer: Self-pay

## 2024-01-13 ENCOUNTER — Other Ambulatory Visit: Payer: Self-pay

## 2024-01-13 ENCOUNTER — Other Ambulatory Visit (HOSPITAL_COMMUNITY): Payer: Self-pay

## 2024-01-13 NOTE — Progress Notes (Signed)
 Specialty Pharmacy Refill Coordination Note  Spoke with Patrick Johns  [name] is a 53 y.o. male contacted today regarding refills of specialty medication(s) Tenofovir  Disoproxil Fumarate (VIREAD )  Doses on hand: 8   Patient requested: Pickup at [facility] at [place] date: 01/15/24  Medication will be filled on 01/14/24.

## 2024-01-28 ENCOUNTER — Other Ambulatory Visit: Payer: Self-pay

## 2024-02-05 ENCOUNTER — Other Ambulatory Visit (HOSPITAL_COMMUNITY): Payer: Self-pay

## 2024-02-12 ENCOUNTER — Other Ambulatory Visit: Payer: Self-pay

## 2024-02-12 NOTE — Progress Notes (Signed)
 Specialty Pharmacy Refill Coordination Note  Patrick Johns is a 53 y.o. male contacted today regarding refills of specialty medication(s) Tenofovir  Disoproxil Fumarate (VIREAD )   Patient requested Pickup at [facility] at [place] date: 02/16/24   Medication will be filled on 10.03.25.

## 2024-02-13 ENCOUNTER — Other Ambulatory Visit: Payer: Self-pay

## 2024-02-27 ENCOUNTER — Other Ambulatory Visit (HOSPITAL_COMMUNITY): Payer: Self-pay

## 2024-03-18 ENCOUNTER — Other Ambulatory Visit (HOSPITAL_COMMUNITY): Payer: Self-pay

## 2024-03-22 ENCOUNTER — Other Ambulatory Visit: Payer: Self-pay

## 2024-03-22 ENCOUNTER — Other Ambulatory Visit: Payer: Self-pay | Admitting: Pharmacy Technician

## 2024-03-22 NOTE — Progress Notes (Signed)
 Specialty Pharmacy Refill Coordination Note  Patrick Johns is a 53 y.o. male contacted today regarding refills of specialty medication(s) Tenofovir  Disoproxil Fumarate (VIREAD )   Patient requested Pickup at [facility] at [place] date: 03/25/24   Medication will be filled on: 03/24/24

## 2024-03-23 ENCOUNTER — Other Ambulatory Visit: Payer: Self-pay

## 2024-03-28 ENCOUNTER — Other Ambulatory Visit (HOSPITAL_COMMUNITY): Payer: Self-pay

## 2024-03-30 ENCOUNTER — Other Ambulatory Visit (HOSPITAL_COMMUNITY): Payer: Self-pay

## 2024-04-20 ENCOUNTER — Other Ambulatory Visit: Payer: Self-pay

## 2024-04-20 NOTE — Progress Notes (Signed)
 Specialty Pharmacy Refill Coordination Note  Patrick Johns is a 53 y.o. male contacted today regarding refills of specialty medication(s) Tenofovir  Disoproxil Fumarate (VIREAD )   Patient requested Pickup at [facility] at [place] date: 04/21/24   Medication will be filled on: 04/21/24

## 2024-04-21 ENCOUNTER — Other Ambulatory Visit: Payer: Self-pay

## 2024-04-27 ENCOUNTER — Encounter: Payer: Self-pay | Admitting: Sports Medicine

## 2024-04-28 ENCOUNTER — Other Ambulatory Visit: Payer: Self-pay

## 2024-04-28 ENCOUNTER — Ambulatory Visit: Admitting: Sports Medicine

## 2024-04-28 ENCOUNTER — Other Ambulatory Visit (INDEPENDENT_AMBULATORY_CARE_PROVIDER_SITE_OTHER)

## 2024-04-28 DIAGNOSIS — M25512 Pain in left shoulder: Secondary | ICD-10-CM | POA: Diagnosis not present

## 2024-04-28 DIAGNOSIS — M7532 Calcific tendinitis of left shoulder: Secondary | ICD-10-CM

## 2024-04-28 DIAGNOSIS — G8929 Other chronic pain: Secondary | ICD-10-CM

## 2024-04-28 DIAGNOSIS — M75102 Unspecified rotator cuff tear or rupture of left shoulder, not specified as traumatic: Secondary | ICD-10-CM

## 2024-04-28 MED ORDER — MELOXICAM 15 MG PO TABS: 15.0000 mg | ORAL_TABLET | Freq: Every day | ORAL | 0 refills | Status: AC

## 2024-04-28 NOTE — Progress Notes (Signed)
 Patient says that he has had pain for years that has gotten worse in the last couple of weeks. He has had physical therapy in the past for his arm, but never for his shoulder. He has pain and limited ROM with the shoulder in any motion where his arm moves away from his body. He works with [name], who recommended he see Dr. Burnetta for ultrasound evaluation.

## 2024-04-28 NOTE — Progress Notes (Signed)
 Patrick Johns - 53 y.o. male MRN [id number]  Date of birth: 11-19-1970  Office Visit Note: Visit Date: 04/28/2024 PCP: Regino Slater, MD Referred by: Regino Slater, MD  Subjective: Chief Complaint  Patient presents with   Left Shoulder - Pain   HPI: Patrick Johns is a pleasant 53 y.o. male who presents today for acute on chronic left shoulder pain.  Patrick Johns has had pain in the left shoulder for a few years, although this has become exacerbated over the last few weeks.  He does also notice he was doing a push-up contest with his family around this time that seem to make the pain worse but denied any specific pop or [place] injury.  He has pain with abduction and with reaching his arm away from the body.  He did see [name] for physical therapy but his pain was limiting his motion, so referred here for further evaluation.  He is not taking any medication consistently for the pain.  Pertinent ROS were reviewed with the patient and found to be negative unless otherwise specified above in HPI.   Assessment & Plan: Visit Diagnoses:  1. Chronic left shoulder pain   2. Calcific tendinitis of left shoulder   3. Nontraumatic tear of left supraspinatus tendon    Plan: Impression is acute exacerbation of chronic left shoulder pain with both x-ray and ultrasound evidence of significant calcification within the rotator cuff interval.  This is most notable in the supraspinatus tendon, to a lesser degree the infraspinatus and articular side of the subacromial joint.  I do think this is the main driver of his pain, ultrasound does show concern for likely underlying partial to high-grade supraspinatus tearing.  Given the notable calcifications and his pain from this, I would like to bring him back for an ultrasound-guided injection only into this region of the subacromial joint.  Following this, I would like him to get started back in physical therapy with John O'Halloran 1 week following  this.  We will start him on a short course of anti-inflammatories in which he will take meloxicam  15 mg daily for 2 weeks scheduled and then discontinue to taper off.  Follow-up: Return for Make appt for US -guided calcific ten injection only.   Meds & Orders:  Meds ordered this encounter  Medications   meloxicam  (MOBIC ) 15 MG tablet    Sig: Take 1 tablet (15 mg total) by mouth daily. Take daily x 2 weeks scheduled, then as needed    Dispense:  21 tablet    Refill:  0    Orders Placed This Encounter  Procedures   XR Shoulder Left   US  Extrem Up Left Comp     Procedures: No procedures performed      Clinical History: No specialty comments available.  He reports that he has never smoked. He has never used smokeless tobacco. No results for input(s): HGBA1C, LABURIC in the last 8760 hours.  Objective:    Physical Exam  Gen: Well-appearing, in no acute distress; non-toxic CV: Well-perfused. Warm.  Resp: Breathing unlabored on room air; no wheezing. Psych: Fluid speech in conversation; appropriate affect; normal thought process  Ortho Exam - Left shoulder: There is mild effusion noted over the subacromial joint but no glenohumeral joint effusion.  There is limitation in active range of motion with forward flexion and abduction.  Positive drop arm test, positive empty can testing.  There is weakness associated with pain with resisted external rotation, preserved internal rotation.  No  bony tenderness over the [facility] joint.  Imaging: US  Extrem Up Left Comp Result Date: 04/28/2024 [facility] Complete US  of Shoulder, Left Patient was seated on exam table and shoulder US  examination was performed using high frequency linear probe. The biceps tendon was visualized within the bicipital groove in both longitudinal and transverse axis with no tearing, there is a mild degree of fluid within the sheath on long-axis. Tendon fibers intact without signs of irregularity. The subscapularis tendon was  visualized in both longitudinal and transverse axis with intact tendon inserting at the inferior lesser tubercle of the humerus, although mild insertional tendinopathy noted. The supraspinatus tendon was visualized in longitudinal, transverse, and dynamic views. There were significant calcifications noted overlying the mid-posterior supraspinatus near the infraspinatus cross-over, moreso articular sided. There were regions of mixed hypoechoic change within the tendon, concerning for associated high-grade or partial tearing. The infraspinatus and teres minor tendons were visualized in both longitudinal and transverse axis with tendon insertion at the middle facet of the great tubercle of the humerus with no signs of tearing, hypoechoic changes or tissue irregularity seen, re-demonstrated calcification noted near the infraspinatus footprint. The posterior glenohumeral joint was visualized with proper alignment, no significant arthropathy, no effusion. [facility] Joint was visualized without bursal distension, mild spurring with arthritic changes.   Significant calcification noted within the rotator cuff interval, within the supraspinatus > infraspinatus, with likely associated partial tearing of the supraspinatus tendon.  XR Shoulder Left Result Date: 04/28/2024 4 view x-ray of the left shoulder including AP, Grashey, scapular Y and axial view were ordered and reviewed by myself today.  X-rays demonstrate mild AC joint arthritic change.  There is notable calcification superior to the humeral head, likely in the region of the overlying supraspinatus > infraspinatus region.  Likely indicative of calcific tendinopathy versus calcific loose body.  There is no significant glenohumeral joint arthritic change.  No acute fracture noted.  Mild downsloping acromion.         Past Medical/Family/Surgical/Social History: Medications & Allergies reviewed per EMR, new medications updated. There are no active problems to  display for this patient.  Past Medical History:  Diagnosis Date   Hypertension    No family history on file. Past Surgical History:  Procedure Laterality Date   APPENDECTOMY     CHOLECYSTECTOMY     Social History   Occupational History   Not on file  Tobacco Use   Smoking status: Never   Smokeless tobacco: Never  Vaping Use   Vaping status: Never Used  Substance and Sexual Activity   Alcohol use: Not Currently   Drug use: Not Currently   Sexual activity: Not on file

## 2024-04-30 ENCOUNTER — Ambulatory Visit: Admitting: Sports Medicine

## 2024-04-30 ENCOUNTER — Other Ambulatory Visit: Payer: Self-pay

## 2024-04-30 DIAGNOSIS — G8929 Other chronic pain: Secondary | ICD-10-CM

## 2024-04-30 DIAGNOSIS — M7532 Calcific tendinitis of left shoulder: Secondary | ICD-10-CM

## 2024-04-30 DIAGNOSIS — M25512 Pain in left shoulder: Secondary | ICD-10-CM | POA: Diagnosis not present

## 2024-04-30 MED ORDER — BUPIVACAINE HCL 0.25 % IJ SOLN
2.0000 mL | INTRAMUSCULAR | Status: AC | PRN
  Administered 2024-04-30: 2 mL via INTRA_ARTICULAR

## 2024-04-30 MED ORDER — LIDOCAINE HCL 1 % IJ SOLN
2.0000 mL | INTRAMUSCULAR | Status: AC | PRN
  Administered 2024-04-30: 2 mL

## 2024-04-30 MED ORDER — METHYLPREDNISOLONE ACETATE 40 MG/ML IJ SUSP
40.0000 mg | INTRAMUSCULAR | Status: AC | PRN
  Administered 2024-04-30: 40 mg via INTRA_ARTICULAR

## 2024-04-30 NOTE — Progress Notes (Signed)
" ° °  Procedure Note  Patient: Patrick Johns             Date of Birth: Feb 19, 1971           MRN: [id number]             Visit Date: 04/30/2024  Procedures: Visit Diagnoses:  1. Calcific tendinitis of left shoulder   2. Chronic left shoulder pain    Large Joint Inj: L subacromial bursa on 04/30/2024 11:08 AM Indications: pain and diagnostic evaluation Details: 22 G 1.5 in needle, ultrasound-guided lateral approach Medications: 2 mL lidocaine 1 %; 2 mL bupivacaine 0.25 %; 40 mg methylPREDNISolone acetate 40 MG/ML (2 cc D50) Outcome: tolerated well, no immediate complications  US -Guided Supraspinatus Calcific Tendon Tenotomy and SAJ Injection, Left Shoulder  After discussion on risks/benefits/indications, informed verbal consent was obtained. A timeout was then performed. Patient was seated on table in exam room. The patient's shoulder was prepped with chloraprep and alcohol swabs and utilizing lateral approach with ultrasound guidance, the patient's supraspinatus tendon and the origin of the calcification was identified. The area was anesthesized first with 5 mL of lidocaine 1%. Following analgesia, using a 22-G, 1.5 needle under ultrasound guidance, the needle was inserted into the calcification of the supraspinatus and in-plane tenotomy was performed with mutliple needle fenestrations. The calcification as well as the subacromial bursa was then subsequently injected with 2:2:2:1 mixture of lidocaine:bupivicaine:D50W:betamethasone via an in-plane approach. Patient tolerated the procedure well without immediate complications.   Procedure, treatment alternatives, risks and benefits explained, specific risks discussed. Consent was given by the patient. Immediately prior to procedure a time out was called to verify the correct patient, procedure, equipment, support staff and site/side marked as required. Patient was prepped and draped in the usual sterile fashion.         - patient  tolerated procedure well, discussed post-injection protocol - Will ice 2-3 times daily for the next 72 hours - Meloxicam  50 mg daily for 2 weeks and then taper off - Will return to Patrick Johns for PT at the 2-week mark from the injection, I will follow-up with him after this depending on his degree of improvement as he works through PT with Patrick Lonell Sprang, DO Primary Care Sports Medicine Physician  [facility]  This note was dictated using Dragon naturally speaking software and may contain errors in syntax, spelling, or content which have not been identified prior to signing this note.         "

## 2024-05-03 ENCOUNTER — Other Ambulatory Visit: Payer: Self-pay

## 2024-05-04 ENCOUNTER — Other Ambulatory Visit: Payer: Self-pay

## 2024-05-12 ENCOUNTER — Other Ambulatory Visit: Payer: Self-pay

## 2024-05-14 ENCOUNTER — Other Ambulatory Visit: Payer: Self-pay

## 2024-05-14 NOTE — Progress Notes (Signed)
 Specialty Pharmacy Ongoing Clinical Assessment Note  Patrick Johns is a 54 y.o. male who is being followed by the specialty pharmacy service for RxSp Hepatitis B   Patient's specialty medication(s) reviewed today: Tenofovir  Disoproxil Fumarate (VIREAD )   Missed doses in the last 4 weeks: 2   Patient/Caregiver did not have any additional questions or concerns.   Therapeutic benefit summary: Patient is achieving benefit   Adverse events/side effects summary: No adverse events/side effects   Patient's therapy is appropriate to: Continue    Goals Addressed             This Visit's Progress    Achieve sustained HBV viral load suppression   On track    Patient is on track. Patient will maintain adherence. Per visit note from 11/24/23, viral load is undetectable.         Follow up: 12 months  [facility]

## 2024-05-14 NOTE — Progress Notes (Signed)
 Specialty Pharmacy Refill Coordination Note  Patrick Johns is a 54 y.o. male contacted today regarding refills of specialty medication(s) Tenofovir  Disoproxil Fumarate (VIREAD )   Patient requested Pickup at [facility] at [place] date: 05/19/24   Medication will be filled on: 05/18/24

## 2024-05-18 ENCOUNTER — Other Ambulatory Visit: Payer: Self-pay

## 2024-06-11 ENCOUNTER — Other Ambulatory Visit: Payer: Self-pay

## 2024-06-11 ENCOUNTER — Other Ambulatory Visit (HOSPITAL_COMMUNITY): Payer: Self-pay

## 2024-06-15 ENCOUNTER — Other Ambulatory Visit: Payer: Self-pay

## 2024-06-17 ENCOUNTER — Other Ambulatory Visit: Payer: Self-pay

## 2024-06-17 NOTE — Progress Notes (Signed)
 Specialty Pharmacy Refill Coordination Note  Patrick Johns is a 55 y.o. male contacted today regarding refills of specialty medication(s) Tenofovir  Disoproxil Fumarate (VIREAD )   Patient requested Pickup at [facility] at [place] date: 06/18/24   Medication will be filled on: 06/17/24
